# Patient Record
Sex: Male | Born: 1940 | Race: Black or African American | Hispanic: No | Smoking: Never smoker
Health system: Southern US, Community
[De-identification: ages and names within clinical notes are randomized; demographics above are authoritative.]

## PROBLEM LIST (undated history)

## (undated) HISTORY — PX: CHOLECYSTECTOMY: SHX55

## (undated) HISTORY — PX: COLON SURGERY: SHX602

## (undated) HISTORY — PX: NASAL SINUS SURGERY: SHX719

## (undated) HISTORY — PX: HERNIA REPAIR: SHX51

---

## 2016-05-01 ENCOUNTER — Encounter (HOSPITAL_BASED_OUTPATIENT_CLINIC_OR_DEPARTMENT_OTHER): Payer: Self-pay | Admitting: *Deleted

## 2016-05-01 ENCOUNTER — Emergency Department (HOSPITAL_BASED_OUTPATIENT_CLINIC_OR_DEPARTMENT_OTHER): Payer: Medicare Other

## 2016-05-01 ENCOUNTER — Emergency Department (HOSPITAL_BASED_OUTPATIENT_CLINIC_OR_DEPARTMENT_OTHER)
Admission: EM | Admit: 2016-05-01 | Discharge: 2016-05-01 | Disposition: A | Discharge status: Home / Self Care | Payer: Medicare Other | Attending: Emergency Medicine | Admitting: Emergency Medicine

## 2016-05-01 DIAGNOSIS — Z79899 Other long term (current) drug therapy: Secondary | ICD-10-CM | POA: Insufficient documentation

## 2016-05-01 DIAGNOSIS — R600 Localized edema: Secondary | ICD-10-CM | POA: Insufficient documentation

## 2016-05-01 DIAGNOSIS — R0602 Shortness of breath: Secondary | ICD-10-CM | POA: Diagnosis present

## 2016-05-01 DIAGNOSIS — D869 Sarcoidosis, unspecified: Secondary | ICD-10-CM

## 2016-05-01 LAB — URINALYSIS, ROUTINE W REFLEX MICROSCOPIC
Bilirubin Urine: NEGATIVE
Glucose, UA: NEGATIVE mg/dL
HGB URINE DIPSTICK: NEGATIVE
Ketones, ur: NEGATIVE mg/dL
LEUKOCYTES UA: NEGATIVE
Nitrite: NEGATIVE
PROTEIN: NEGATIVE mg/dL
SPECIFIC GRAVITY, URINE: 1.005 (ref 1.005–1.030)
pH: 6 (ref 5.0–8.0)

## 2016-05-01 LAB — CBC WITH DIFFERENTIAL/PLATELET
BASOS ABS: 0 10*3/uL (ref 0.0–0.1)
Basophils Relative: 0 %
Eosinophils Absolute: 0.2 10*3/uL (ref 0.0–0.7)
Eosinophils Relative: 3 %
HCT: 45.3 % (ref 39.0–52.0)
Hemoglobin: 15.1 g/dL (ref 13.0–17.0)
LYMPHS ABS: 2.3 10*3/uL (ref 0.7–4.0)
LYMPHS PCT: 35 %
MCH: 30.1 pg (ref 26.0–34.0)
MCHC: 33.3 g/dL (ref 30.0–36.0)
MCV: 90.2 fL (ref 78.0–100.0)
Monocytes Absolute: 0.6 10*3/uL (ref 0.1–1.0)
Monocytes Relative: 9 %
Neutro Abs: 3.5 10*3/uL (ref 1.7–7.7)
Neutrophils Relative %: 53 %
PLATELETS: 201 10*3/uL (ref 150–400)
RBC: 5.02 MIL/uL (ref 4.22–5.81)
RDW: 13.3 % (ref 11.5–15.5)
WBC: 6.6 10*3/uL (ref 4.0–10.5)

## 2016-05-01 LAB — BASIC METABOLIC PANEL
ANION GAP: 7 (ref 5–15)
BUN: 14 mg/dL (ref 6–20)
CO2: 29 mmol/L (ref 22–32)
Calcium: 9 mg/dL (ref 8.9–10.3)
Chloride: 104 mmol/L (ref 101–111)
Creatinine, Ser: 1.22 mg/dL (ref 0.61–1.24)
GFR calc non Af Amer: 56 mL/min — ABNORMAL LOW (ref >60)
GLUCOSE: 151 mg/dL — AB (ref 65–99)
POTASSIUM: 4.5 mmol/L (ref 3.5–5.1)
Sodium: 140 mmol/L (ref 135–145)

## 2016-05-01 LAB — BRAIN NATRIURETIC PEPTIDE: B NATRIURETIC PEPTIDE 5: 18.9 pg/mL (ref 0.0–100.0)

## 2016-05-01 LAB — TROPONIN I: Troponin I: <0.03 ng/mL (ref <0.03)

## 2016-05-01 MED ORDER — IPRATROPIUM-ALBUTEROL 0.5-2.5 (3) MG/3ML IN SOLN
3.0000 mL | Freq: Four times a day (QID) | RESPIRATORY_TRACT | Status: DC
  Administered 2016-05-01: 3 mL via RESPIRATORY_TRACT
  Filled 2016-05-01: qty 3

## 2016-05-01 NOTE — ED Triage Notes (Addendum)
States he uses home oxygen as needed but for the past few days he has used it almost continuously x the past 2 days. Dry cough for a week. States he just finished pulmonary rehab. Pressure across his chest.

## 2016-05-01 NOTE — ED Notes (Signed)
Spoke to after hours for cardiologist, they are sending records to us.

## 2016-05-01 NOTE — ED Provider Notes (Signed)
[facility] Note   CSN: [id number] Arrival date & time: 05/01/16  1431     History   Chief Complaint Chief Complaint  Patient presents with  . Shortness of Breath    HPI Omar Burton is a 76 y.o. male.  HPI He is visiting from [place]. He has sarcoidosis and had recently completed pulmonary rehabilitation therapy about March 9. He reports he was doing very well. He reports he was exercising up to 30 minutes while wearing oxygen. He reports his resting oxygen saturation without oxygen was 96%. He reports at that time he was able to walk out to his car without significant dyspnea. He reports he does have his oxygen tanks with him and albuterol to use as needed. He reports however he is not using his oxygen because he doesn't feel that he's been needing it. He however notes that ever since completing rehabilitation, at which point time he was feeling very good that he's been feeling increasingly fatigued over the past couple of weeks and that his exercise capacity is diminishing. He denies fever. He reports he has some amount of chronic cough but has not been having any increasing cough or sputum production. He reports he gets a "heaviness" on his chest at times. That has been going on for 2 weeks as well. He reports he does chronically gets some swelling in his ankles but it is worse than normal for him. He reports he saw his cardiologist the week before he left. There were no medication changes. He reports he's had a heart murmur since he was very young. No past medical history on file.  There are no active problems to display for this patient.   Past Surgical History:  Procedure Laterality Date  . CHOLECYSTECTOMY    . COLON SURGERY    . HERNIA REPAIR    . NASAL SINUS SURGERY         Home Medications    Prior to Admission medications   Medication Sig Start Date End Date Taking? Authorizing Provider  ALBUTEROL IN Inhale into the lungs.   Yes Historical  Provider, MD  amLODipine (NORVASC) 10 MG tablet Take 10 mg by mouth daily.   Yes Historical Provider, MD  Cholecalciferol (VITAMIN D PO) Take by mouth.   Yes Historical Provider, MD  OXYGEN Inhale into the lungs.   Yes Historical Provider, MD  TAMSULOSIN HCL PO Take by mouth.   Yes Historical Provider, MD  Vardenafil HCl (LEVITRA PO) Take by mouth.   Yes Historical Provider, MD    Family History No family history on file.  Social History Social History  Substance Use Topics  . Smoking status: Never Smoker  . Smokeless tobacco: Never Used  . Alcohol use Yes     Allergies   Patient has no known allergies.   Review of Systems Review of Systems 10 Systems reviewed and are negative for acute change except as noted in the HPI.   Physical Exam Updated Vital Signs BP (!) 146/81   Pulse 70   Temp 97.9 F (36.6 C) (Oral)   Resp (!) 22   Ht 5\' 10"  (1.778 m)   Wt 198 lb (89.8 kg)   SpO2 95%   BMI 28.41 kg/m   Physical Exam  Constitutional: He is oriented to person, place, and time. He appears well-developed and well-nourished.  Patient is alert and well in appearance. He is talking without any signs of respiratory distress. Oxygen saturation maintained at 96-97% on room air.  HENT:  Head: Normocephalic and atraumatic.  Mouth/Throat: Oropharynx is clear and moist.  Eyes: EOM are normal.  Neck: Neck supple.  Cardiovascular:  Heart has a sinus arrhythmia observed on the monitor. Rates range from 70s to 110s. 2/6 systolic ejection murmur.  Pulmonary/Chest: Effort normal and breath sounds normal. No respiratory distress.  Abdominal: Soft. He exhibits no distension. There is no tenderness. There is no guarding.  Musculoskeletal: Normal range of motion.  1+ pitting edema bilateral ankles. Calves are nontender.  Neurological: He is alert and oriented to person, place, and time. No cranial nerve deficit. He exhibits normal muscle tone. Coordination normal.  Skin: Skin is warm and  dry.  Psychiatric: He has a normal mood and affect.     ED Treatments / Results  Labs (all labs ordered are listed, but only abnormal results are displayed) Labs Reviewed  BASIC METABOLIC PANEL - Abnormal; Notable for the following:       Result Value   Glucose, Bld 151 (*)    GFR calc non Af Amer 56 (*)    All other components within normal limits  TROPONIN I  BRAIN NATRIURETIC PEPTIDE  CBC WITH DIFFERENTIAL/PLATELET  URINALYSIS, ROUTINE W REFLEX MICROSCOPIC    EKG  EKG Interpretation  Date/Time:  Thursday May 01 2016 14:51:09 EDT Ventricular Rate:  80 PR Interval:    QRS Duration: 149 QT Interval:  395 QTC Calculation: 456 R Axis:   159 Text Interpretation:  Sinus rhythm Right bundle branch block Baseline wander in lead(s) V2 agree. possible lateral ischemia. no old comparison Confirmed by Donnald Garre, MD, Le[facility] Conners [phone]) on 05/01/2016 3:01:15 PM       Radiology Dg Chest 2 View  Result Date: 05/01/2016 CLINICAL DATA:  76 year old male with nonproductive cough and shortness of breath for 2 weeks. Initial encounter. Patient reports sarcoidosis. EXAM: CHEST  2 VIEW COMPARISON:  None. FINDINGS: Overall normal lung volumes, but widespread course and patchy mostly interstitial appearing pulmonary opacity with basilar predominance. Areas of architectural distortion suspected at both lung bases. No evidence of hilar mediastinal lymphadenopathy. Mediastinal contours are within normal limits. Lung apices appear relatively normal. No pneumothorax or pleural effusion. No acute osseous abnormality identified. Cholecystectomy surgical clips. Negative bowel gas pattern. IMPRESSION: Basilar predominant appearing chronic lung disease with areas of suspected architectural distortion. No comparison exam. Superimposed acute viral/atypical respiratory infection would be difficult to exclude. No pleural effusion. Electronically Signed   By: Odessa Fleming M.D.   On: 05/01/2016 15:42     Procedures Procedures (including critical care time)  Medications Ordered in ED Medications  ipratropium-albuterol (DUONEB) 0.5-2.5 (3) MG/3ML nebulizer solution 3 mL (3 mLs Nebulization Given 05/01/16 1732)     Initial Impression / Assessment and Plan / ED Course  I have reviewed the triage vital signs and the nursing notes.  Pertinent labs & imaging results that were available during my care of the patient were reviewed by me and considered in my medical decision making (see chart for details).      Final Clinical Impressions(s) / ED Diagnoses   Final diagnoses:  SOB (shortness of breath)  Sarcoidosis (HCC)   Clinically well in appearance. He has no hypoxia. Pulmonary air flow is good. BNP and troponin are in normal limits. At this time, I do feel patient is stable to continue to follow up with his primary care doctor, cardiologist and pulmonologist. he is returning home tomorrow. He is counseled on contacting them as soon as he is home to schedule follow-up.  Counseled on signs and symptoms which are returned. New Prescriptions New Prescriptions   No medications on file     Arby BarretteMarcy Naidelyn Parrella, MD 05/01/16 1819

## 2016-05-01 NOTE — ED Notes (Signed)
MD at bedside. Patient states he is visiting from out of town. Patient with hx of sarcoidosis. Patient recently completed pulmonary rehab. Patient states he uses oxygen at home with activity. Patient states he has started getting more short of air more frequently with activity. Patient reports his baseline O2 sat is 97% at rest. Patient is speaking in complete sentences, without distress, maintaining SPO2 at 96-97% while talking.   Patient reports a "heavy" feeling across his chest for @ 2 weeks, states that he has had pneumonia before and feels the pain is similar. Patient reports he has had a slight cough. Using his albuterol nebulizer.   Bilateral lower leg swelling, +1 pitting edema.

## 2018-08-04 IMAGING — CR DG CHEST 2V
2 series · 2 of 2 positions shown · non-contrast
Comparison: None.

CLINICAL DATA: 75-year-old male with nonproductive cough and
shortness of breath for 2 weeks. Initial encounter. Patient reports
sarcoidosis.

EXAM:
CHEST  2 VIEW

[w chest pa]
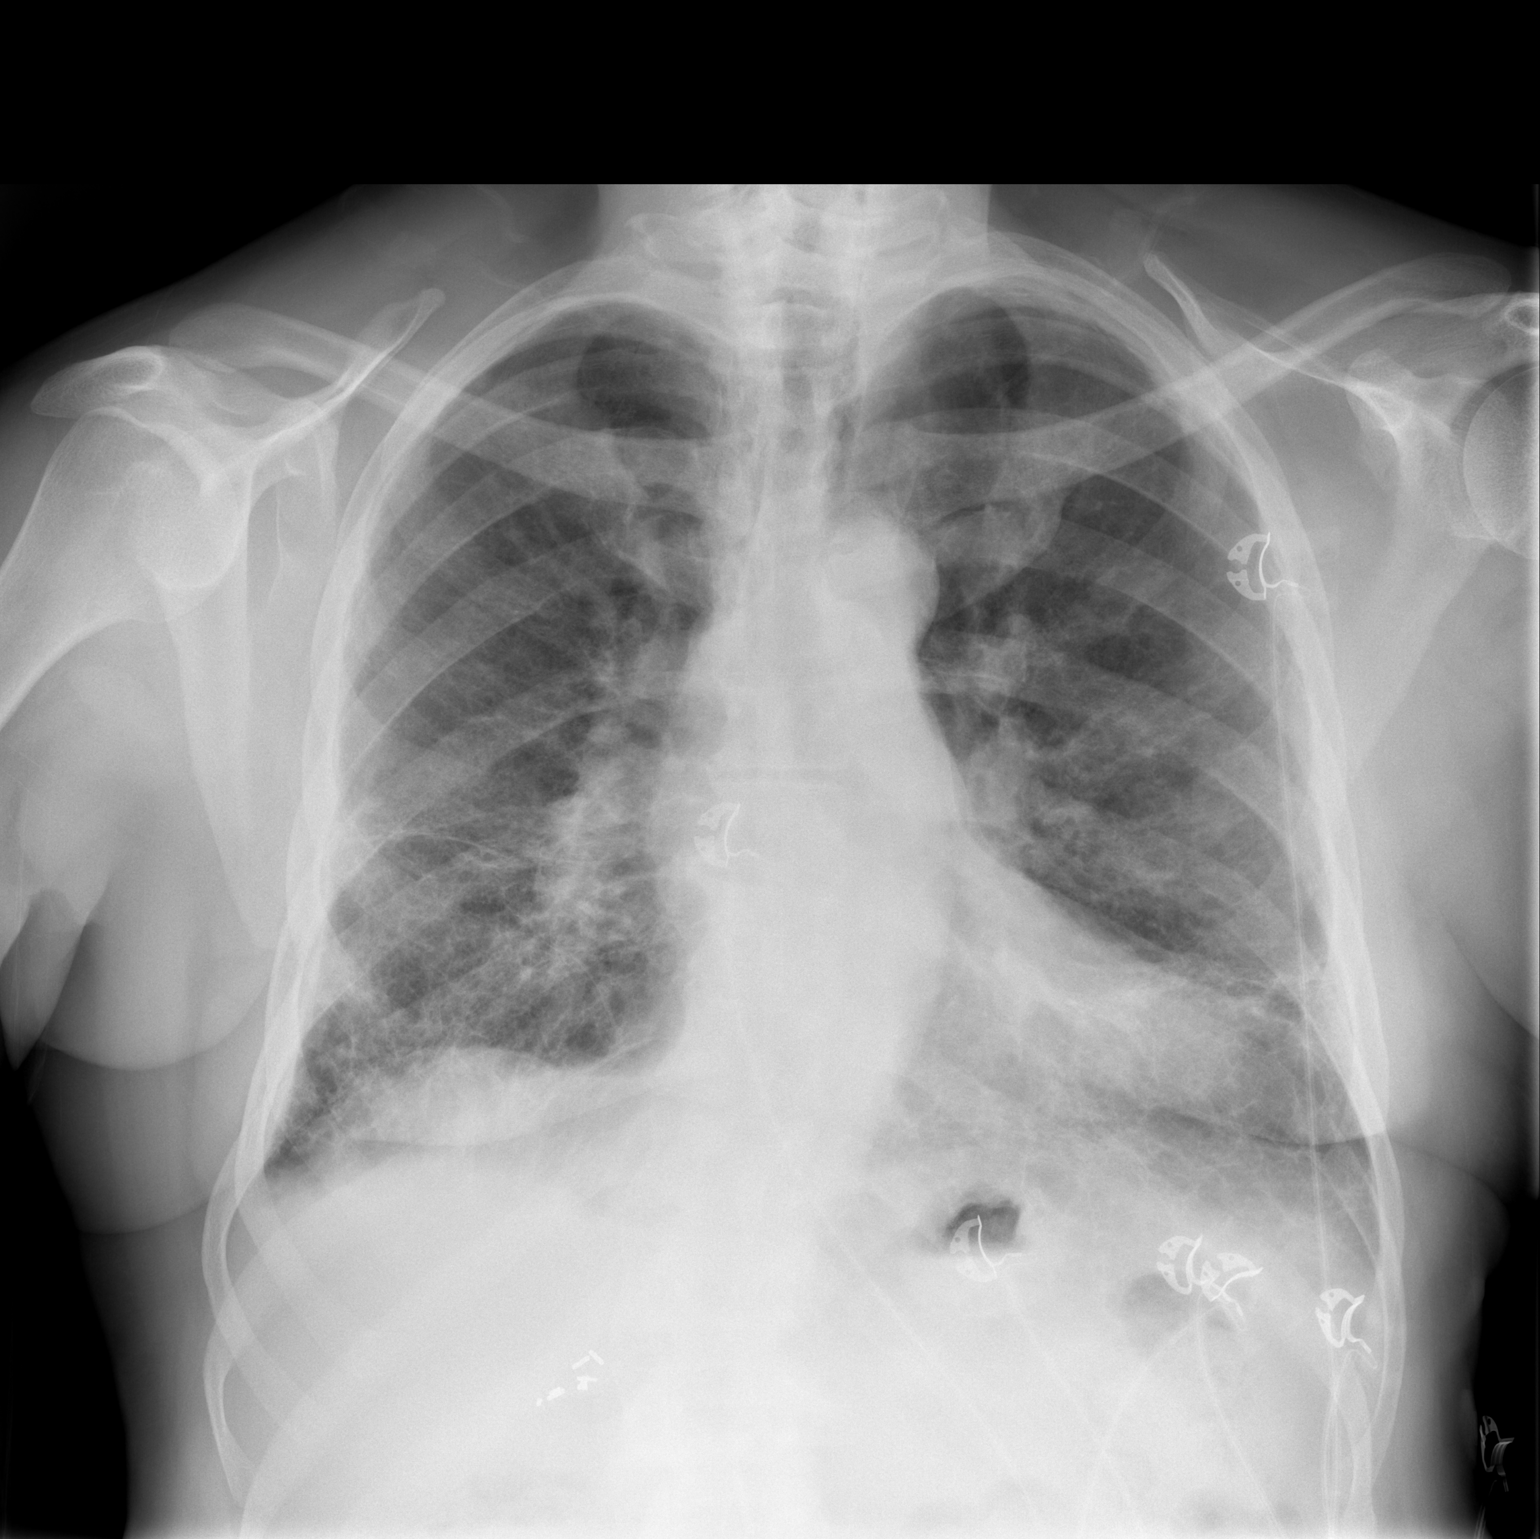

[w chest lat]
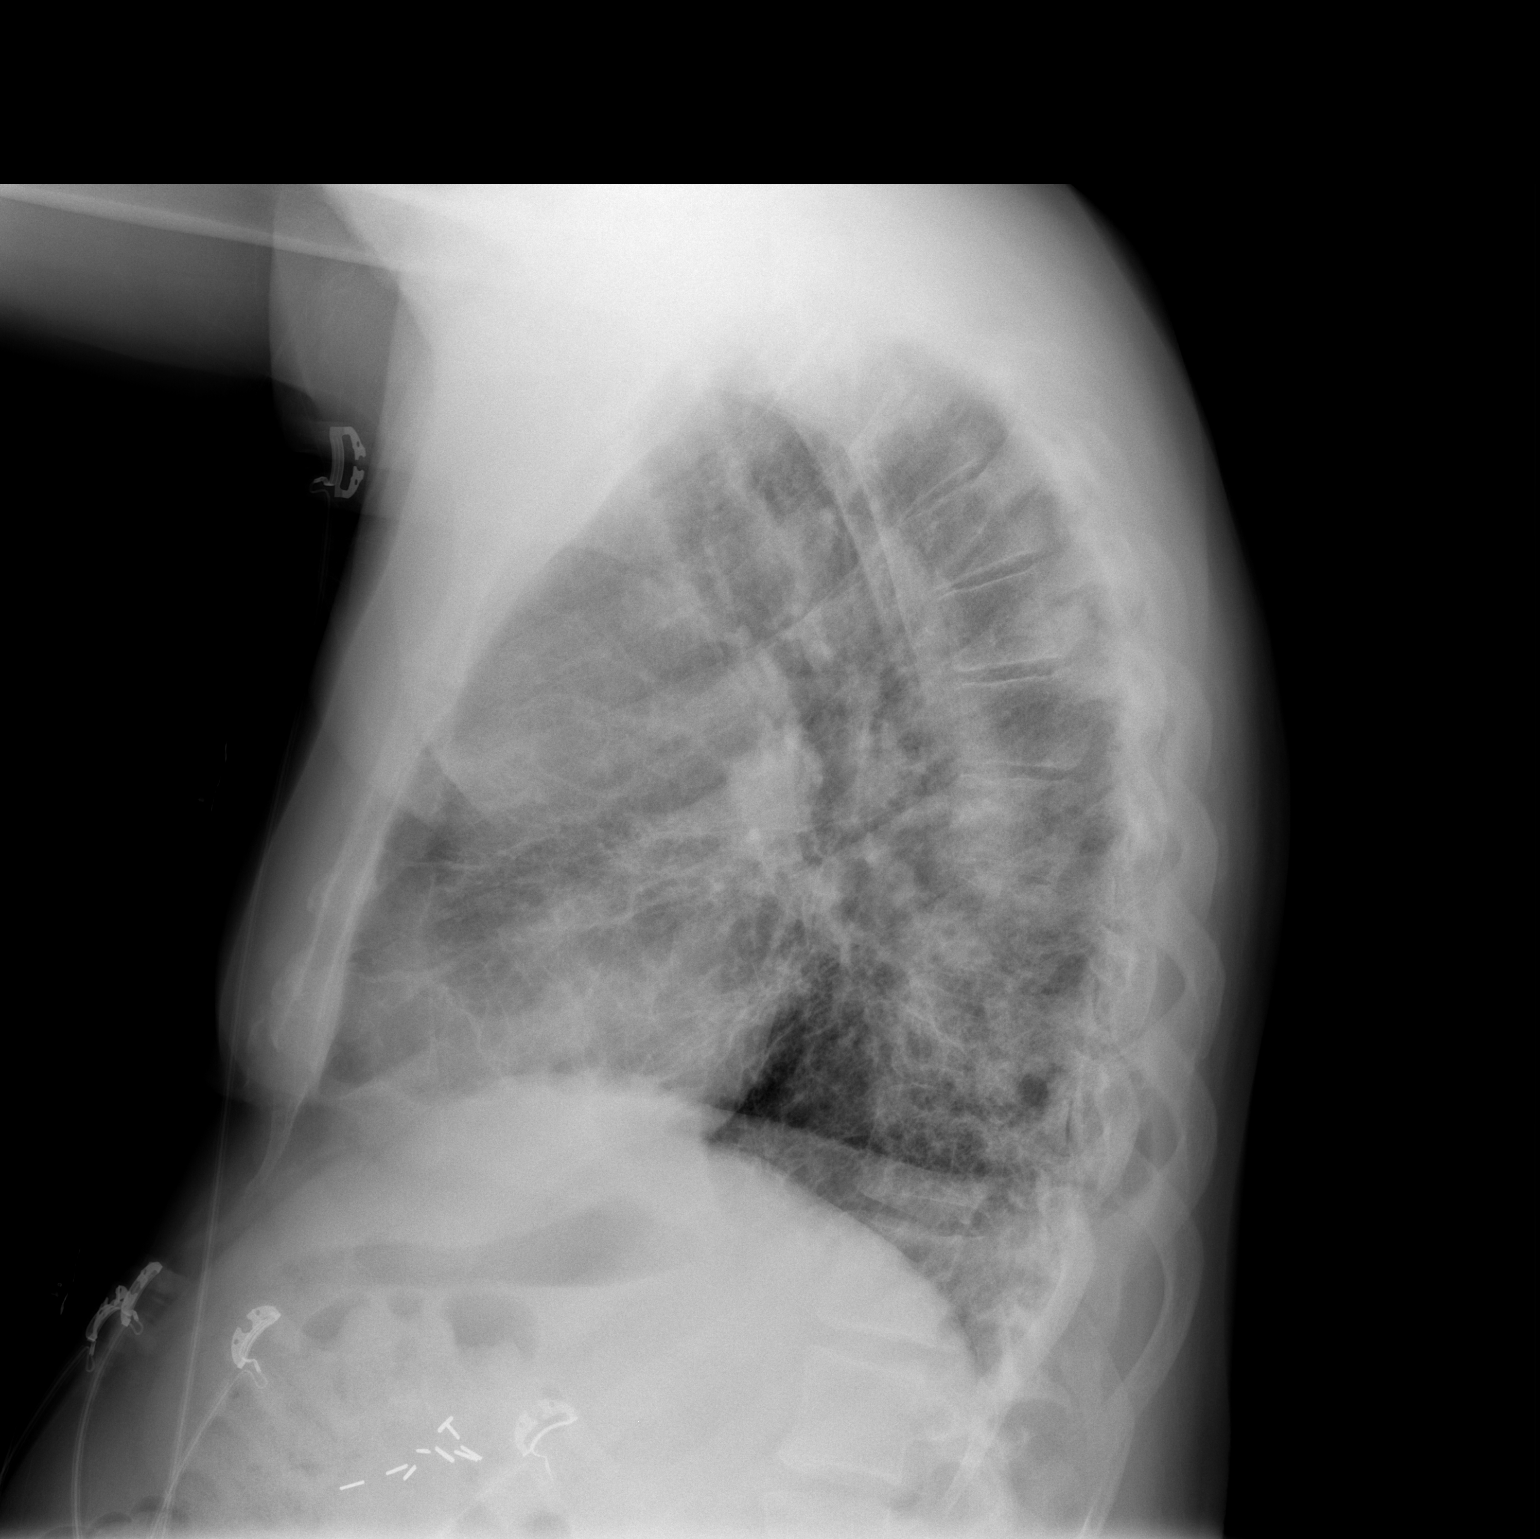

[2 of 2 positions shown; findings below may reference images not displayed]

FINDINGS: Overall normal lung volumes, but widespread course and patchy mostly
interstitial appearing pulmonary opacity with basilar predominance.
Areas of architectural distortion suspected at both lung bases. No
evidence of hilar mediastinal lymphadenopathy. Mediastinal contours
are within normal limits. Lung apices appear relatively normal. No
pneumothorax or pleural effusion. No acute osseous abnormality
identified. Cholecystectomy surgical clips. Negative bowel gas
pattern.
IMPRESSION: Basilar predominant appearing chronic lung disease with areas of
suspected architectural distortion.

No comparison exam. Superimposed acute viral/atypical respiratory
infection would be difficult to exclude. No pleural effusion.
